# Patient Record
Sex: Female | Born: 1965 | Race: White | Hispanic: No | Marital: Single | Smoking: Former smoker
Health system: Southern US, Community
[De-identification: ages and names within clinical notes are randomized; demographics above are authoritative.]

## PROBLEM LIST (undated history)

## (undated) DIAGNOSIS — X58XXXA Exposure to other specified factors, initial encounter: Secondary | ICD-10-CM

## (undated) DIAGNOSIS — J302 Other seasonal allergic rhinitis: Secondary | ICD-10-CM

## (undated) DIAGNOSIS — G500 Trigeminal neuralgia: Secondary | ICD-10-CM

## (undated) DIAGNOSIS — F32A Depression, unspecified: Secondary | ICD-10-CM

## (undated) HISTORY — PX: NASAL SINUS SURGERY: SHX719

## (undated) HISTORY — DX: Exposure to other specified factors, initial encounter: X58.XXXA

## (undated) HISTORY — PX: CHOLECYSTECTOMY: SHX55

## (undated) HISTORY — PX: FRACTURE SURGERY: SHX138

---

## 2008-03-19 ENCOUNTER — Emergency Department: Payer: Self-pay | Admitting: Emergency Medicine

## 2008-09-16 ENCOUNTER — Emergency Department: Payer: Self-pay | Admitting: Emergency Medicine

## 2013-02-08 DIAGNOSIS — X58XXXA Exposure to other specified factors, initial encounter: Secondary | ICD-10-CM

## 2013-02-08 HISTORY — DX: Exposure to other specified factors, initial encounter: X58.XXXA

## 2013-02-26 ENCOUNTER — Emergency Department: Payer: Self-pay | Admitting: Emergency Medicine

## 2013-02-26 LAB — CBC
HCT: 39 % (ref 35.0–47.0)
HGB: 13 g/dL (ref 12.0–16.0)
MCH: 25.9 pg — ABNORMAL LOW (ref 26.0–34.0)
Platelet: 202 10*3/uL (ref 150–440)
WBC: 11.4 10*3/uL — ABNORMAL HIGH (ref 3.6–11.0)

## 2013-02-26 LAB — BASIC METABOLIC PANEL
Anion Gap: 4 — ABNORMAL LOW (ref 7–16)
Calcium, Total: 9.1 mg/dL (ref 8.5–10.1)
Co2: 25 mmol/L (ref 21–32)
Creatinine: 1.13 mg/dL (ref 0.60–1.30)
EGFR (Non-African Amer.): 57 — ABNORMAL LOW
Glucose: 169 mg/dL — ABNORMAL HIGH (ref 65–99)
Osmolality: 278 (ref 275–301)

## 2013-02-26 LAB — PROTIME-INR
INR: 0.9
Prothrombin Time: 12.4 secs (ref 11.5–14.7)

## 2014-09-15 ENCOUNTER — Other Ambulatory Visit (HOSPITAL_COMMUNITY): Payer: Self-pay | Admitting: Otolaryngology

## 2014-09-15 DIAGNOSIS — K2100 Gastro-esophageal reflux disease with esophagitis, without bleeding: Secondary | ICD-10-CM

## 2014-09-15 DIAGNOSIS — J322 Chronic ethmoidal sinusitis: Secondary | ICD-10-CM

## 2014-09-15 DIAGNOSIS — J320 Chronic maxillary sinusitis: Secondary | ICD-10-CM

## 2014-09-16 ENCOUNTER — Ambulatory Visit (HOSPITAL_COMMUNITY)
Admission: RE | Admit: 2014-09-16 | Discharge: 2014-09-16 | Disposition: A | Discharge status: Home / Self Care | Payer: BLUE CROSS/BLUE SHIELD | Source: Ambulatory Visit | Attending: Otolaryngology | Admitting: Otolaryngology

## 2014-09-16 ENCOUNTER — Encounter (HOSPITAL_COMMUNITY): Payer: Self-pay

## 2014-09-16 DIAGNOSIS — J342 Deviated nasal septum: Secondary | ICD-10-CM | POA: Diagnosis not present

## 2014-09-16 DIAGNOSIS — K2100 Gastro-esophageal reflux disease with esophagitis, without bleeding: Secondary | ICD-10-CM

## 2014-09-16 DIAGNOSIS — R0981 Nasal congestion: Secondary | ICD-10-CM | POA: Diagnosis not present

## 2014-09-16 DIAGNOSIS — J322 Chronic ethmoidal sinusitis: Secondary | ICD-10-CM

## 2014-09-16 DIAGNOSIS — K011 Impacted teeth: Secondary | ICD-10-CM | POA: Insufficient documentation

## 2014-09-16 DIAGNOSIS — J320 Chronic maxillary sinusitis: Secondary | ICD-10-CM

## 2014-09-21 ENCOUNTER — Telehealth: Payer: Self-pay | Admitting: *Deleted

## 2014-09-21 NOTE — Telephone Encounter (Signed)
Talked with pt and she is going to come in for an appointment tomorrow at 1130. I told pt to arrive 11:00/11:15 for appt. Pt verbalized understanding.

## 2014-09-22 ENCOUNTER — Ambulatory Visit (INDEPENDENT_AMBULATORY_CARE_PROVIDER_SITE_OTHER): Payer: BLUE CROSS/BLUE SHIELD | Admitting: Neurology

## 2014-09-22 ENCOUNTER — Encounter: Payer: Self-pay | Admitting: Neurology

## 2014-09-22 VITALS — BP 129/80 | HR 62 | Temp 97.0°F | Ht 66.0 in | Wt 153.0 lb

## 2014-09-22 DIAGNOSIS — G500 Trigeminal neuralgia: Secondary | ICD-10-CM

## 2014-09-22 MED ORDER — OXCARBAZEPINE ER 600 MG PO TB24: 600.0000 mg | ORAL_TABLET | Freq: Every day | ORAL | Status: AC
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
## 2014-09-22 NOTE — Patient Instructions (Signed)
Overall you are doing fairly well but I do want to suggest a few things today:   Remember to drink plenty of fluid, eat healthy meals and do not skip any meals. Try to eat protein with a every meal and eat a healthy snack such as fruit or nuts in between meals. Try to keep a regular sleep-wake schedule and try to exercise daily, particularly in the form of walking, 20-30 minutes a day, if you can.   As far as your medications are concerned, I would like to suggest: Oxtellar XR 600mg  at night  I would like to see you back as needed, sooner if we need to. Please call us with any interim questions, concerns, problems, updates or refill requests.   Please also call us for any test results so we can go over those with you on the phone.  My clinical assistant and will answer any of your questions and relay your messages to me and also relay most of my messages to you.   Our phone number is [phone]. We also have an after hours call service for urgent matters and there is a physician on-call for urgent questions. For any emergencies you know to call 911 or go to the nearest emergency room

## 2014-09-23 ENCOUNTER — Ambulatory Visit (INDEPENDENT_AMBULATORY_CARE_PROVIDER_SITE_OTHER): Payer: BLUE CROSS/BLUE SHIELD | Admitting: Neurology

## 2014-09-23 ENCOUNTER — Telehealth: Payer: Self-pay | Admitting: *Deleted

## 2014-09-23 VITALS — BP 122/79 | HR 60 | Temp 97.0°F | Ht 66.0 in | Wt 152.2 lb

## 2014-09-23 DIAGNOSIS — R519 Headache, unspecified: Secondary | ICD-10-CM

## 2014-09-23 DIAGNOSIS — S0432XA Injury of trigeminal nerve, left side, initial encounter: Secondary | ICD-10-CM

## 2014-09-23 DIAGNOSIS — R51 Headache: Secondary | ICD-10-CM | POA: Diagnosis not present

## 2014-09-23 NOTE — Telephone Encounter (Signed)
Talked with patient about the pain that she is still having in her face. She said the injections did not help and the pain came back yesterday at 6:00pm last night after the appt she had yesterday. Pt wants to know if there is anything that can be done. She stated she is at work right now and to have Dr. Lucia GaskinsAhern leave a detailed message on [phone], which is her cell phone. She said she will call back when she gets a break. I told her I would talk with Dr. Lucia GaskinsAhern and let her know. Pt verbalized understanding.

## 2014-09-23 NOTE — Telephone Encounter (Signed)
Patient called back and stated she's finished with work for today and will be able to come in for another injection if needed.  Please call and advise.

## 2014-09-23 NOTE — Telephone Encounter (Signed)
Called and spoke with pt to let her know we will fit her in. Pt verbalized understanding and said she will be here a little after 12pm today.

## 2014-09-24 ENCOUNTER — Encounter: Payer: Self-pay | Admitting: Neurology

## 2014-09-24 DIAGNOSIS — S0430XA Injury of trigeminal nerve, unspecified side, initial encounter: Secondary | ICD-10-CM | POA: Insufficient documentation

## 2014-09-24 DIAGNOSIS — R519 Headache, unspecified: Secondary | ICD-10-CM | POA: Insufficient documentation

## 2014-09-24 DIAGNOSIS — R51 Headache: Secondary | ICD-10-CM
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
## 2014-09-29 ENCOUNTER — Ambulatory Visit: Admit: 2014-09-29 | Disposition: A | Discharge status: Home / Self Care | Payer: Self-pay | Admitting: Family Medicine

## 2014-10-12 ENCOUNTER — Encounter: Payer: Self-pay | Admitting: Neurology

## 2014-10-12 ENCOUNTER — Encounter: Payer: Self-pay | Admitting: *Deleted

## 2014-10-12 ENCOUNTER — Ambulatory Visit (INDEPENDENT_AMBULATORY_CARE_PROVIDER_SITE_OTHER): Payer: BLUE CROSS/BLUE SHIELD | Admitting: Neurology

## 2014-10-12 VITALS — BP 138/91 | HR 74 | Ht 66.0 in | Wt 154.6 lb

## 2014-10-12 DIAGNOSIS — G6289 Other specified polyneuropathies: Secondary | ICD-10-CM

## 2014-10-12 MED ORDER — LIDOCAINE 5 % EX OINT: 1.0000 "application " | TOPICAL_OINTMENT | CUTANEOUS | Status: AC | PRN

## 2014-10-12 MED ORDER — PREGABALIN 50 MG PO CAPS: 100.0000 mg | ORAL_CAPSULE | Freq: Two times a day (BID) | ORAL | Status: AC

## 2014-10-12 NOTE — Patient Instructions (Signed)
Overall you are doing fairly well but I do want to suggest a few things today:   Remember to drink plenty of fluid, eat healthy meals and do not skip any meals. Try to eat protein with a every meal and eat a healthy snack such as fruit or nuts in between meals. Try to keep a regular sleep-wake schedule and try to exercise daily, particularly in the form of walking, 20-30 minutes a day, if you can.   As far as your medications are concerned, I would like to suggest: topical lidocaine, Lyrica 50mg  twice daily  As far as diagnostic testing: EMG/NCS  I would like to see you back in tomorrow May 5th, sooner if we need to. Please call us with any interim questions, concerns, problems, updates or refill requests.   Please also call us for any test results so we can go over those with you on the phone.  My clinical assistant and will answer any of your questions and relay your messages to me and also relay most of my messages to you.   Our phone number is [phone]. We also have an after hours call service for urgent matters and there is a physician on-call for urgent questions. For any emergencies you know to call 911 or go to the nearest emergency room
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
## 2014-10-13 ENCOUNTER — Encounter: Payer: Self-pay | Admitting: Neurology

## 2014-10-14 ENCOUNTER — Telehealth: Payer: Self-pay

## 2014-10-14 LAB — COMPREHENSIVE METABOLIC PANEL
A/G RATIO: 1.5 (ref 1.1–2.5)
ALK PHOS: 99 IU/L (ref 39–117)
ALT: 9 IU/L (ref 0–32)
AST: 20 IU/L (ref 0–40)
Albumin: 4.3 g/dL (ref 3.5–5.5)
BILIRUBIN TOTAL: 0.4 mg/dL (ref 0.0–1.2)
BUN/Creatinine Ratio: 13 (ref 9–23)
BUN: 14 mg/dL (ref 6–24)
CO2: 25 mmol/L (ref 18–29)
CREATININE: 1.06 mg/dL — AB (ref 0.57–1.00)
Calcium: 9.9 mg/dL (ref 8.7–10.2)
Chloride: 104 mmol/L (ref 97–108)
GFR calc Af Amer: 71 mL/min/{1.73_m2} (ref >59)
GFR calc non Af Amer: 61 mL/min/{1.73_m2} (ref >59)
GLUCOSE: 100 mg/dL — AB (ref 65–99)
Globulin, Total: 2.8 g/dL (ref 1.5–4.5)
POTASSIUM: 4.2 mmol/L (ref 3.5–5.2)
Sodium: 146 mmol/L — ABNORMAL HIGH (ref 134–144)
TOTAL PROTEIN: 7.1 g/dL (ref 6.0–8.5)

## 2014-10-14 LAB — MULTIPLE MYELOMA PANEL, SERUM
ALPHA2 GLOB SERPL ELPH-MCNC: 0.8 g/dL (ref 0.4–1.2)
Albumin SerPl Elph-Mcnc: 3.6 g/dL (ref 3.2–5.6)
Albumin/Glob SerPl: 1.1 (ref 0.7–2.0)
Alpha 1: 0.2 g/dL (ref 0.1–0.4)
B-Globulin SerPl Elph-Mcnc: 1.1 g/dL (ref 0.6–1.3)
GAMMA GLOB SERPL ELPH-MCNC: 1.2 g/dL (ref 0.5–1.6)
Globulin, Total: 3.5 g/dL (ref 2.0–4.5)
IgA/Immunoglobulin A, Serum: 131 mg/dL (ref 87–352)
IgG (Immunoglobin G), Serum: 1103 mg/dL (ref 700–1600)
IgM (Immunoglobulin M), Srm: 232 mg/dL — ABNORMAL HIGH (ref 26–217)

## 2014-10-14 LAB — HEMOGLOBIN A1C
Est. average glucose Bld gHb Est-mCnc: 140 mg/dL
Hgb A1c MFr Bld: 6.5 % — ABNORMAL HIGH (ref 4.8–5.6)

## 2014-10-14 LAB — PAN-ANCA
ANCA Proteinase 3: <3.5 U/mL (ref 0.0–3.5)
Atypical pANCA: <1:20 {titer}

## 2014-10-14 LAB — B12 AND FOLATE PANEL
Folate: 10.3 ng/mL (ref >3.0)
VITAMIN B 12: 362 pg/mL (ref 211–946)

## 2014-10-14 LAB — RHEUMATOID FACTOR: Rhuematoid fact SerPl-aCnc: 8.7 IU/mL (ref 0.0–13.9)

## 2014-10-14 LAB — HIV ANTIBODY (ROUTINE TESTING W REFLEX): HIV Screen 4th Generation wRfx: NONREACTIVE

## 2014-10-14 LAB — TSH: TSH: 1.16 u[IU]/mL (ref 0.450–4.500)

## 2014-10-14 LAB — HEPATITIS C ANTIBODY: HEP C VIRUS AB: 0.1 {s_co_ratio} (ref 0.0–0.9)

## 2014-10-14 LAB — HIGH SENSITIVITY CRP: CRP, High Sensitivity: 3.64 mg/L — ABNORMAL HIGH (ref 0.00–3.00)

## 2014-10-14 LAB — RPR: RPR Ser Ql: NONREACTIVE

## 2014-10-14 LAB — SEDIMENTATION RATE: Sed Rate: 5 mm/hr (ref 0–40)

## 2014-10-14 LAB — ANA W/REFLEX: ANA: NEGATIVE

## 2014-10-14 NOTE — Telephone Encounter (Signed)
Pt was in formed her hgbA1c level at 6.5, she was informed that this was close to diabetes and that she should follow up with PCP concerning the neuropathy in her feet.

## 2014-10-17 ENCOUNTER — Encounter: Payer: Self-pay | Admitting: Neurology

## 2014-10-20 ENCOUNTER — Ambulatory Visit: Payer: BLUE CROSS/BLUE SHIELD | Admitting: Neurology

## 2015-05-10 ENCOUNTER — Other Ambulatory Visit: Payer: Self-pay | Admitting: Family Medicine

## 2015-05-10 DIAGNOSIS — Z1231 Encounter for screening mammogram for malignant neoplasm of breast: Secondary | ICD-10-CM
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
## 2018-02-17 ENCOUNTER — Emergency Department (HOSPITAL_COMMUNITY)
Admission: EM | Admit: 2018-02-17 | Discharge: 2018-02-17 | Disposition: A | Discharge status: Home / Self Care | Payer: No Typology Code available for payment source | Attending: Emergency Medicine | Admitting: Emergency Medicine

## 2018-02-17 ENCOUNTER — Encounter (HOSPITAL_COMMUNITY): Payer: Self-pay | Admitting: *Deleted

## 2018-02-17 DIAGNOSIS — Z79899 Other long term (current) drug therapy: Secondary | ICD-10-CM | POA: Insufficient documentation

## 2018-02-17 DIAGNOSIS — T592X1A Toxic effect of formaldehyde, accidental (unintentional), initial encounter: Secondary | ICD-10-CM | POA: Diagnosis not present

## 2018-02-17 DIAGNOSIS — S058X1A Other injuries of right eye and orbit, initial encounter: Secondary | ICD-10-CM | POA: Insufficient documentation

## 2018-02-17 DIAGNOSIS — F17210 Nicotine dependence, cigarettes, uncomplicated: Secondary | ICD-10-CM | POA: Diagnosis not present

## 2018-02-17 DIAGNOSIS — Y939 Activity, unspecified: Secondary | ICD-10-CM | POA: Insufficient documentation

## 2018-02-17 DIAGNOSIS — X58XXXA Exposure to other specified factors, initial encounter: Secondary | ICD-10-CM | POA: Insufficient documentation

## 2018-02-17 DIAGNOSIS — Y9289 Other specified places as the place of occurrence of the external cause: Secondary | ICD-10-CM | POA: Insufficient documentation

## 2018-02-17 DIAGNOSIS — T2690XA Corrosion of unspecified eye and adnexa, part unspecified, initial encounter: Secondary | ICD-10-CM | POA: Insufficient documentation

## 2018-02-17 DIAGNOSIS — Y990 Civilian activity done for income or pay: Secondary | ICD-10-CM | POA: Insufficient documentation

## 2018-02-17 MED ORDER — TETRACAINE HCL 0.5 % OP SOLN
2.0000 [drp] | Freq: Once | OPHTHALMIC | Status: AC
  Administered 2018-02-17: 2 [drp] via OPHTHALMIC
  Filled 2018-02-17: qty 4

## 2018-02-17 MED ORDER — FLUORESCEIN SODIUM 1 MG OP STRP
1.0000 | ORAL_STRIP | Freq: Once | OPHTHALMIC | Status: AC
  Administered 2018-02-17: 1 via OPHTHALMIC
  Filled 2018-02-17: qty 1
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
## 2018-02-17 NOTE — Discharge Instructions (Addendum)
Do not touch or irritate your eye.  If your eye is very dry/irritated, you may use lubricating eye drops. Return to the emergency room if you develop vision loss, severe eye pain, difficulty moving your eye, drainage from your eye, or any new or concerning symptoms.
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
## 2019-04-13 ENCOUNTER — Other Ambulatory Visit: Payer: Self-pay | Admitting: *Deleted

## 2019-04-13 DIAGNOSIS — Z20822 Contact with and (suspected) exposure to covid-19: Secondary | ICD-10-CM

## 2019-04-14 LAB — NOVEL CORONAVIRUS, NAA: SARS-CoV-2, NAA: NOT DETECTED

## 2019-07-08 ENCOUNTER — Ambulatory Visit: Payer: PRIVATE HEALTH INSURANCE | Attending: Internal Medicine

## 2019-07-08 DIAGNOSIS — Z20822 Contact with and (suspected) exposure to covid-19: Secondary | ICD-10-CM | POA: Insufficient documentation

## 2019-07-09 LAB — NOVEL CORONAVIRUS, NAA: SARS-CoV-2, NAA: NOT DETECTED

## 2019-12-24 ENCOUNTER — Ambulatory Visit
Admission: EM | Admit: 2019-12-24 | Discharge: 2019-12-24 | Disposition: A | Discharge status: Home / Self Care | Payer: PRIVATE HEALTH INSURANCE

## 2019-12-24 ENCOUNTER — Other Ambulatory Visit: Payer: Self-pay

## 2019-12-24 ENCOUNTER — Ambulatory Visit
Admission: EM | Admit: 2019-12-24 | Discharge: 2019-12-24 | Disposition: A | Discharge status: Home / Self Care | Payer: Self-pay

## 2019-12-24 DIAGNOSIS — R197 Diarrhea, unspecified: Secondary | ICD-10-CM

## 2019-12-24 DIAGNOSIS — R112 Nausea with vomiting, unspecified: Secondary | ICD-10-CM | POA: Diagnosis not present

## 2019-12-24 DIAGNOSIS — R1030 Lower abdominal pain, unspecified: Secondary | ICD-10-CM

## 2019-12-24 DIAGNOSIS — K529 Noninfective gastroenteritis and colitis, unspecified: Secondary | ICD-10-CM

## 2019-12-24 HISTORY — DX: Other seasonal allergic rhinitis: J30.2

## 2019-12-24 HISTORY — DX: Depression, unspecified: F32.A

## 2019-12-24 HISTORY — DX: Trigeminal neuralgia: G50.0

## 2019-12-24 MED ORDER — ONDANSETRON 4 MG PO TBDP
4.0000 mg | ORAL_TABLET | Freq: Once | ORAL | Status: AC
  Administered 2019-12-24: 4 mg via ORAL

## 2019-12-24 MED ORDER — ONDANSETRON HCL 4 MG PO TABS: 4.0000 mg | ORAL_TABLET | Freq: Four times a day (QID) | ORAL | 0 refills | Status: AC

## 2019-12-24 NOTE — ED Triage Notes (Signed)
Pt presents with diarrhea starting 2 days ago.  Reports periumbilical abdominal pain radiating to R flank.  Has also been vomiting what she describes as straight bile.  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  No fever.  Reports tenderness in periumbilical area.  Denies CP, SOB, HA, urinary changes.
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
## 2019-12-24 NOTE — Discharge Instructions (Addendum)
You have received a zofran in our office  I have sent in zofran to your pharmacy. You may take one tablet every 6 hours as needed for nausea and vomiting  Follow up in the ER for worsening abdominal pain, persistent nausea and vomiting, increased diarrhea  Cannot rule out appendicitis in this office

## 2020-01-13 ENCOUNTER — Ambulatory Visit
Admission: RE | Admit: 2020-01-13 | Discharge: 2020-01-13 | Disposition: A | Discharge status: Home / Self Care | Payer: PRIVATE HEALTH INSURANCE | Source: Ambulatory Visit | Attending: Emergency Medicine | Admitting: Emergency Medicine

## 2020-01-13 ENCOUNTER — Other Ambulatory Visit: Payer: Self-pay

## 2020-01-13 ENCOUNTER — Ambulatory Visit
Admission: EM | Admit: 2020-01-13 | Discharge: 2020-01-13 | Disposition: A | Discharge status: Home / Self Care | Payer: PRIVATE HEALTH INSURANCE | Attending: Emergency Medicine | Admitting: Emergency Medicine

## 2020-01-13 ENCOUNTER — Ambulatory Visit
Admission: RE | Admit: 2020-01-13 | Discharge: 2020-01-13 | Disposition: A | Discharge status: Home / Self Care | Payer: PRIVATE HEALTH INSURANCE | Attending: Emergency Medicine | Admitting: Emergency Medicine

## 2020-01-13 DIAGNOSIS — R05 Cough: Secondary | ICD-10-CM

## 2020-01-13 DIAGNOSIS — J0110 Acute frontal sinusitis, unspecified: Secondary | ICD-10-CM

## 2020-01-13 DIAGNOSIS — Z87891 Personal history of nicotine dependence: Secondary | ICD-10-CM | POA: Diagnosis not present

## 2020-01-13 DIAGNOSIS — R059 Cough, unspecified: Secondary | ICD-10-CM

## 2020-01-13 MED ORDER — AMOXICILLIN 875 MG PO TABS: 875.0000 mg | ORAL_TABLET | Freq: Two times a day (BID) | ORAL | 0 refills | Status: AC

## 2020-01-13 NOTE — Discharge Instructions (Signed)
Go to [facility] for your chest xray.  I will call you with the results this morning.
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
## 2020-01-13 NOTE — ED Triage Notes (Signed)
Patient reports sinus pressure, nasal drainage, and productive cough x5 days. Patient requesting COVID testing.

## 2020-01-14 LAB — NOVEL CORONAVIRUS, NAA: SARS-CoV-2, NAA: NOT DETECTED

## 2020-01-14 LAB — SARS-COV-2, NAA 2 DAY TAT

## 2020-09-12 ENCOUNTER — Other Ambulatory Visit: Payer: Self-pay

## 2020-09-12 ENCOUNTER — Encounter: Payer: Self-pay | Admitting: Emergency Medicine

## 2020-09-12 ENCOUNTER — Ambulatory Visit
Admission: EM | Admit: 2020-09-12 | Discharge: 2020-09-12 | Disposition: A | Discharge status: Home / Self Care | Payer: PRIVATE HEALTH INSURANCE

## 2020-09-12 DIAGNOSIS — J0110 Acute frontal sinusitis, unspecified: Secondary | ICD-10-CM | POA: Diagnosis not present

## 2020-09-12 MED ORDER — AMOXICILLIN-POT CLAVULANATE 875-125 MG PO TABS: 1.0000 | ORAL_TABLET | Freq: Two times a day (BID) | ORAL | 0 refills | Status: DC

## 2020-09-12 NOTE — Discharge Instructions (Addendum)
Treating you for a sinus infection Continue the nasal saline spray.  You can try some sudafed.  Follow up as needed for continued or worsening symptoms

## 2020-09-12 NOTE — ED Triage Notes (Signed)
Patient c/o nasal bleeding (RT nostril) and facial pain x 2 days.   Patient endorses "sinus pressures". Patient states " I have greenish with blood drainage from nose".   Patient endorses blood present when "blowing nose". Patient denies a copious amount of blood.   Patient states " at times I'll wake up and I feel like I'm choking".   Patient has used OTC cold/sinus medication and "steam treatments" with no relief of symptoms.
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
## 2021-02-16 ENCOUNTER — Other Ambulatory Visit: Payer: Self-pay

## 2021-02-16 ENCOUNTER — Ambulatory Visit
Admission: EM | Admit: 2021-02-16 | Discharge: 2021-02-16 | Disposition: A | Discharge status: Home / Self Care | Payer: No Typology Code available for payment source | Attending: Emergency Medicine | Admitting: Emergency Medicine

## 2021-02-16 DIAGNOSIS — J0100 Acute maxillary sinusitis, unspecified: Secondary | ICD-10-CM | POA: Diagnosis not present

## 2021-02-16 MED ORDER — AMOXICILLIN 875 MG PO TABS: 875.0000 mg | ORAL_TABLET | Freq: Two times a day (BID) | ORAL | 0 refills | Status: AC
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
## 2021-02-16 NOTE — ED Triage Notes (Signed)
Pt presents with sore throat and cough and nasal congestion, has taken 3 covid test at home that where negative

## 2021-02-16 NOTE — Discharge Instructions (Addendum)
Take the amoxicillin as directed.    Your COVID test is pending.  You should self quarantine until the test result is back.    Take Tylenol or ibuprofen as needed for fever or discomfort.  Rest and keep yourself hydrated.    Follow-up with your primary care provider if your symptoms are not improving.     

## 2021-02-18 LAB — NOVEL CORONAVIRUS, NAA: SARS-CoV-2, NAA: NOT DETECTED

## 2021-02-18 LAB — SARS-COV-2, NAA 2 DAY TAT

## 2022-01-23 ENCOUNTER — Other Ambulatory Visit: Payer: Self-pay | Admitting: Specialist

## 2022-01-23 DIAGNOSIS — F17200 Nicotine dependence, unspecified, uncomplicated: Secondary | ICD-10-CM

## 2022-01-23 DIAGNOSIS — J449 Chronic obstructive pulmonary disease, unspecified: Secondary | ICD-10-CM

## 2022-05-16 ENCOUNTER — Emergency Department (HOSPITAL_COMMUNITY)
Admission: EM | Admit: 2022-05-16 | Discharge: 2022-05-17 | Discharge status: Against Medical Advice | Payer: No Typology Code available for payment source

## 2022-05-16 NOTE — ED Notes (Signed)
Patient left.

## 2024-03-23 ENCOUNTER — Ambulatory Visit
